# Patient Record
Sex: Male | Born: 1980 | Race: White | Hispanic: No | Marital: Married | Smoking: Never smoker
Health system: Southern US, Community
[De-identification: ages and names within clinical notes are randomized; demographics above are authoritative.]

## PROBLEM LIST (undated history)

## (undated) DIAGNOSIS — K219 Gastro-esophageal reflux disease without esophagitis: Secondary | ICD-10-CM

## (undated) DIAGNOSIS — J309 Allergic rhinitis, unspecified: Secondary | ICD-10-CM

## (undated) DIAGNOSIS — T7840XA Allergy, unspecified, initial encounter: Secondary | ICD-10-CM

## (undated) HISTORY — DX: Allergy, unspecified, initial encounter: T78.40XA

## (undated) HISTORY — PX: ORTHOPEDIC SURGERY: SHX850

## (undated) HISTORY — DX: Allergic rhinitis, unspecified: J30.9

## (undated) HISTORY — PX: COLONOSCOPY: SHX5424

---

## 2012-04-21 ENCOUNTER — Encounter: Payer: Self-pay | Admitting: Family Medicine

## 2012-04-21 ENCOUNTER — Ambulatory Visit (INDEPENDENT_AMBULATORY_CARE_PROVIDER_SITE_OTHER): Payer: BC Managed Care – PPO | Admitting: Family Medicine

## 2012-04-21 VITALS — BP 130/90 | HR 78 | Temp 98.2°F | Ht >= 80 in | Wt 232.0 lb

## 2012-04-21 DIAGNOSIS — J019 Acute sinusitis, unspecified: Secondary | ICD-10-CM

## 2012-04-21 DIAGNOSIS — J309 Allergic rhinitis, unspecified: Secondary | ICD-10-CM | POA: Insufficient documentation

## 2012-04-21 MED ORDER — AMOXICILLIN 875 MG PO TABS: 875.0000 mg | ORAL_TABLET | Freq: Two times a day (BID) | ORAL | Status: AC

## 2012-04-21 NOTE — Progress Notes (Signed)
  Subjective:    Patient ID: George Jenkins, male    DOB: Nov 15, 1980, 31 y.o.   MRN: [id number]  HPI He has a three-day history of difficulty with sore throat, PND, postnasal drainage and now notes some redness in his eyes. He's had no earache, nasal congestion, fever, chills or coughing. He does have an underlying history of allergies and has had previous desensitization which he said did help. He is also had previous testing for EIB which was negative. He does not smoke. He is getting ready to go on a trip to [place].  Review of Systems     Objective:   Physical Exam alert and in no distress. Tympanic membranes and canals are normal. Throat is clear. Tonsils are normal. Neck is supple without adenopathy or thyromegaly. Cardiac exam shows a regular sinus rhythm without murmurs or gallops. Lungs are clear to auscultation. His mucosa is slightly red with questionable tenderness over maxillary sinuses.        Assessment & Plan:   1. Sinusitis acute  amoxicillin (AMOXIL) 875 MG tablet  2. Allergic rhinitis, mild

## 2012-04-21 NOTE — Patient Instructions (Signed)
Use some Afrin nasal spray before he get on the plane just to be safe

## 2012-11-25 ENCOUNTER — Ambulatory Visit (INDEPENDENT_AMBULATORY_CARE_PROVIDER_SITE_OTHER): Payer: BC Managed Care – PPO | Admitting: Medical

## 2012-11-25 ENCOUNTER — Encounter: Payer: Self-pay | Admitting: Medical

## 2012-11-25 VITALS — BP 130/88 | HR 80 | Temp 98.3°F | Resp 16 | Wt 238.0 lb

## 2012-11-25 DIAGNOSIS — J309 Allergic rhinitis, unspecified: Secondary | ICD-10-CM

## 2012-11-25 DIAGNOSIS — J019 Acute sinusitis, unspecified: Secondary | ICD-10-CM

## 2012-11-25 MED ORDER — AMOXICILLIN 875 MG PO TABS: 875.0000 mg | ORAL_TABLET | Freq: Two times a day (BID) | ORAL | Status: DC

## 2012-11-25 NOTE — Progress Notes (Signed)
Subjective:  George Jenkins is a 32 y.o. male who presents for ongoing URI.  He notes about a month of ongoing head congestion, some sinus pressure.  Having yellow nasal discharge, irritated throat.  Some chest congestion.   Showers and hot tea seems to help.  He has longstanding allergies, uses allegra, but this seems to be an infection, worse than his typically sneezing, runny nose, itchy eyes.  Past history is significant for allergies, sinusitis. Patient is a non-smoker.  Using no other OTC decongestants or other for symptoms.  Denies sick contacts.  No other aggravating or relieving factors.  No other c/o.  History reviewed. No pertinent past medical history.  Objective: Filed Vitals:   11/25/12 1347  BP: 130/88  Pulse: 80  Temp: 98.3 F (36.8 C)  Resp: 16    General appearance: Alert, WD/WN, no distress                             Skin: warm, no rash                           Head: +mild maxillary sinus tenderness                            Eyes: conjunctiva normal, corneas clear, PERRLA                            Ears: pearly TMs, external ear canals normal                          Nose: septum midline, turbinates swollen, with erythema              Mouth/throat: MMM, tongue normal, mild pharyngeal erythema                           Neck: supple, no adenopathy, no thyromegaly, nontender                          Heart: RRR, normal S1, S2, no murmurs                         Lungs: CTA bilaterally, no wheezes, rales, or rhonchi      Assessment and Plan:   Encounter Diagnoses  Name Primary?  . Sinusitis, acute Yes  . Allergic rhinitis     Prescription given for Amoxicillin.  Discussed symptomatic relief, and call or return if worse or not improving in 2-3 days.  In general c/o allegra daily, add on nasal saline flush regularly, and can use Afrin prior to flights.

## 2013-12-15 ENCOUNTER — Ambulatory Visit (INDEPENDENT_AMBULATORY_CARE_PROVIDER_SITE_OTHER): Payer: BC Managed Care – PPO | Admitting: Family Medicine

## 2013-12-15 ENCOUNTER — Encounter: Payer: Self-pay | Admitting: Family Medicine

## 2013-12-15 VITALS — BP 114/74 | HR 80 | Wt 244.0 lb

## 2013-12-15 DIAGNOSIS — Z800 Family history of malignant neoplasm of digestive organs: Secondary | ICD-10-CM | POA: Insufficient documentation

## 2013-12-15 DIAGNOSIS — H109 Unspecified conjunctivitis: Secondary | ICD-10-CM

## 2013-12-15 NOTE — Progress Notes (Signed)
   Subjective:    Patient ID: George Jenkins, male    DOB: November 16, 1980, 33 y.o.   MRN: [id number]  HPI He is here for evaluation of a several day history of difficulty with slight redness in the right eye. He was seen recently in an urgent care Center and her on azithromycin. The drainage is clear from the right eye. He is scheduled for a complete exam. He stated he is hard he had a colonoscopy for screening for colon cancer. Apparently his father had colon cancer in his early 4230s. He is approximately 5 years since his last colonoscopy.  Review of Systems     Objective:   Physical Exam Alert and in no distress. TMs normal. No preauricular nodes noted. Throat is clear. Neck is supple without adenopathy. Right conjunctiva is slightly injected.       Assessment & Plan:  Conjunctivitis  Family history of colon cancer  he is to check on when his last colonoscopy was in we will order it on the 5 year schedule. Recommend conservative care for the conjunctivitis.

## 2013-12-23 ENCOUNTER — Encounter: Payer: Self-pay | Admitting: Family Medicine

## 2013-12-28 ENCOUNTER — Ambulatory Visit (INDEPENDENT_AMBULATORY_CARE_PROVIDER_SITE_OTHER): Payer: BC Managed Care – PPO | Admitting: Family Medicine

## 2013-12-28 ENCOUNTER — Telehealth: Payer: Self-pay | Admitting: Internal Medicine

## 2013-12-28 ENCOUNTER — Encounter: Payer: Self-pay | Admitting: Family Medicine

## 2013-12-28 VITALS — BP 120/90 | HR 80 | Ht >= 80 in | Wt 236.0 lb

## 2013-12-28 DIAGNOSIS — K219 Gastro-esophageal reflux disease without esophagitis: Secondary | ICD-10-CM

## 2013-12-28 DIAGNOSIS — Z9289 Personal history of other medical treatment: Secondary | ICD-10-CM

## 2013-12-28 DIAGNOSIS — K602 Anal fissure, unspecified: Secondary | ICD-10-CM

## 2013-12-28 DIAGNOSIS — R7611 Nonspecific reaction to tuberculin skin test without active tuberculosis: Secondary | ICD-10-CM

## 2013-12-28 DIAGNOSIS — Z0000 Encounter for general adult medical examination without abnormal findings: Secondary | ICD-10-CM

## 2013-12-28 DIAGNOSIS — Z800 Family history of malignant neoplasm of digestive organs: Secondary | ICD-10-CM

## 2013-12-28 DIAGNOSIS — J309 Allergic rhinitis, unspecified: Secondary | ICD-10-CM

## 2013-12-28 LAB — CBC WITH DIFFERENTIAL/PLATELET
BASOS ABS: 0.1 10*3/uL (ref 0.0–0.1)
BASOS PCT: 1 % (ref 0–1)
EOS ABS: 0.5 10*3/uL (ref 0.0–0.7)
EOS PCT: 8 % — AB (ref 0–5)
HEMATOCRIT: 45.4 % (ref 39.0–52.0)
Hemoglobin: 15.8 g/dL (ref 13.0–17.0)
LYMPHS PCT: 21 % (ref 12–46)
Lymphs Abs: 1.2 10*3/uL (ref 0.7–4.0)
MCH: 29.5 pg (ref 26.0–34.0)
MCHC: 34.8 g/dL (ref 30.0–36.0)
MCV: 84.7 fL (ref 78.0–100.0)
MONO ABS: 0.5 10*3/uL (ref 0.1–1.0)
Monocytes Relative: 9 % (ref 3–12)
Neutro Abs: 3.5 10*3/uL (ref 1.7–7.7)
Neutrophils Relative %: 61 % (ref 43–77)
Platelets: 301 10*3/uL (ref 150–400)
RBC: 5.36 MIL/uL (ref 4.22–5.81)
RDW: 14.1 % (ref 11.5–15.5)
WBC: 5.8 10*3/uL (ref 4.0–10.5)

## 2013-12-28 LAB — COMPREHENSIVE METABOLIC PANEL
ALT: 27 U/L (ref 0–53)
AST: 23 U/L (ref 0–37)
Albumin: 4.6 g/dL (ref 3.5–5.2)
Alkaline Phosphatase: 55 U/L (ref 39–117)
BUN: 11 mg/dL (ref 6–23)
CALCIUM: 9.6 mg/dL (ref 8.4–10.5)
CHLORIDE: 103 meq/L (ref 96–112)
CO2: 25 mEq/L (ref 19–32)
CREATININE: 1.06 mg/dL (ref 0.50–1.35)
Glucose, Bld: 104 mg/dL — ABNORMAL HIGH (ref 70–99)
Potassium: 4.3 mEq/L (ref 3.5–5.3)
Sodium: 140 mEq/L (ref 135–145)
Total Bilirubin: 0.6 mg/dL (ref 0.2–1.2)
Total Protein: 7.3 g/dL (ref 6.0–8.3)

## 2013-12-28 LAB — LIPID PANEL
CHOL/HDL RATIO: 3.7 ratio
Cholesterol: 165 mg/dL (ref 0–200)
HDL: 45 mg/dL (ref >39)
LDL Cholesterol: 91 mg/dL (ref 0–99)
Triglycerides: 146 mg/dL (ref <150)
VLDL: 29 mg/dL (ref 0–40)

## 2013-12-28 MED ORDER — DILTIAZEM GEL 2 %: 1.0000 "application " | Freq: Two times a day (BID) | CUTANEOUS | Status: DC

## 2013-12-28 NOTE — Progress Notes (Signed)
Subjective:    Patient ID: George ScarletMichael Jenkins, male    DOB: 09-01-1981, 33 y.o.   MRN: [id number]  HPI He is here for a complete examination. He does have a previous history of positive PPD with treatment with INH for 6 months. This was in the early 1970s. He also has a history of reflux disease and did take a PPI but stopped after he did some reading concerning this. He still is having some reflux symptoms and has made some dietary changes. He is a positive family history for colon cancer and had a colonoscopy approximately 5 years ago. The records are being requested. He does have seasonal allergies and responds well to Allegra. He has had difficulty with rectal bleeding for several months and states that he has had difficulty with hemorrhoids. He states he does feel a lump down there when he has the bleeding. He does not smoke and drinks socially. His marriage is going reasonably well. He does state that he and his wife have been involved in counseling. Surgical history significant for right orchiopexy for undescended testes. The rest of his family and social history was reviewed.   Review of Systems  All other systems reviewed and are negative.      Objective:   Physical Exam BP 120/90  Pulse 80  Ht 6\' 9"  (2.057 m)  Wt 236 lb (107.049 kg)  BMI 25.30 kg/m2  General Appearance:    Alert, cooperative, no distress, appears stated age  Head:    Normocephalic, without obvious abnormality, atraumatic  Eyes:    PERRL, conjunctiva/corneas clear, EOM's intact, fundi    benign  Ears:    Normal TM's and external ear canals  Nose:   Nares normal, mucosa normal, no drainage or sinus   tenderness  Throat:   Lips, mucosa, and tongue normal; teeth and gums normal  Neck:   Supple, no lymphadenopathy;  thyroid:  no   enlargement/tenderness/nodules; no carotid   bruit or JVD  Back:    Spine nontender, no curvature, ROM normal, no CVA     tenderness  Lungs:     Clear to auscultation bilaterally without  wheezes, rales or     ronchi; respirations unlabored  Chest Wall:    No tenderness or deformity   Heart:    Regular rate and rhythm, S1 and S2 normal, no murmur, rub   or gallop  Breast Exam:    No chest wall tenderness, masses or gynecomastia  Abdomen:     Soft, non-tender, nondistended, normoactive bowel sounds,    no masses, no hepatosplenomegaly  Genitalia:    Normal male external genitalia without lesions.  Testicles without masses.  No inguinal hernias.  Rectal:    hemorrhoidal tissue is noted. A fissure is noted at the 6:00 position.   Extremities:   No clubbing, cyanosis or edema  Pulses:   2+ and symmetric all extremities  Skin:   Skin color, texture, turgor normal, no rashes or lesions  Lymph nodes:   Cervical, supraclavicular, and axillary nodes normal  Neurologic:   CNII-XII intact, normal strength, sensation and gait; reflexes 2+ and symmetric throughout          Psych:   Normal mood, affect, hygiene and grooming.          Assessment & Plan:  Routine general medical examination at a health care facility - Plan: CBC with Differential, Comprehensive metabolic panel, Lipid panel  History of positive PPD - Plan: DG Chest 2 View  GERD (  gastroesophageal reflux disease)  Allergic rhinitis  Family history of colon cancer  Anal fissure  I will get information concerning colonoscopy and refer to GI for colonoscopy and also to further discuss his reflux symptoms. His allergies are under good control. I will give him diltiazem and see if this will help with his fissure. Explained that it might possibly need surgical intervention.

## 2013-12-28 NOTE — Telephone Encounter (Signed)
Faxed over release form to Dr. Altamese Dillinghristopher Robben @ [phone]

## 2014-01-04 ENCOUNTER — Telehealth: Payer: Self-pay

## 2014-01-04 NOTE — Telephone Encounter (Signed)
I called and left a message with Maralyn SagoSarah at [facility] regarding pt previous colonoscopy

## 2014-01-04 NOTE — Telephone Encounter (Signed)
George Jenkins states that pt did not have colonoscopy at their office and has no documentation of where he could have been referred to to have a colonoscopy done

## 2014-01-18 ENCOUNTER — Encounter: Payer: Self-pay | Admitting: Family Medicine

## 2014-01-19 ENCOUNTER — Encounter: Payer: Self-pay | Admitting: Internal Medicine

## 2014-01-25 ENCOUNTER — Telehealth: Payer: Self-pay

## 2014-01-25 NOTE — Telephone Encounter (Signed)
Pt.notified

## 2014-01-25 NOTE — Telephone Encounter (Signed)
The chest x-ray should be coated has followup on positive PPD. Tell him he would need to check with GI concerning screening but should most likely screening colonoscopy with family history of colon cancer

## 2014-01-25 NOTE — Telephone Encounter (Signed)
Called pt to inform him of OV with Dr. Bosie ClosSchooler at [facility] and pt states that he is needing to know how the info was coded in order for him to have a colonoscopy as well as the chest x-ray that you are wanting him to have. He does not want to proceed with either until he knows how you have coded the chest x ray and for him to have a repeat colonoscopy for insurance purposes. Please advise

## 2014-06-30 ENCOUNTER — Ambulatory Visit (INDEPENDENT_AMBULATORY_CARE_PROVIDER_SITE_OTHER): Payer: BC Managed Care – PPO | Admitting: Family Medicine

## 2014-06-30 ENCOUNTER — Encounter: Payer: Self-pay | Admitting: Family Medicine

## 2014-06-30 VITALS — BP 130/90 | HR 80 | Temp 97.9°F | Ht >= 80 in | Wt 243.0 lb

## 2014-06-30 DIAGNOSIS — J209 Acute bronchitis, unspecified: Secondary | ICD-10-CM

## 2014-06-30 MED ORDER — AMOXICILLIN 875 MG PO TABS: 875.0000 mg | ORAL_TABLET | Freq: Two times a day (BID) | ORAL | Status: DC

## 2014-06-30 NOTE — Progress Notes (Signed)
   Subjective:    Patient ID: George Jenkins, male    DOB: 04-23-1981, 33 y.o.   MRN: [id number]  HPI He has a five-day history that started with a slight sore throat followed less than 24 hours by productive cough, nasal congestion but no fever, chills, earache. He has a previous history of difficulty with this. He did use NyQuil for this. He does not smoke.   Review of Systems     Objective:   Physical Exam alert and in no distress. Tympanic membranes and canals are normal. Throat is clear. Tonsils are normal. Neck is supple without adenopathy or thyromegaly. Cardiac exam shows a regular sinus rhythm without murmurs or gallops. Lungs are clear to auscultation.        Assessment & Plan:  Acute bronchitis, unspecified organism - Plan: amoxicillin (AMOXIL) 875 MG tablet  recommend using Afrin nasal spray when he flies to equalize sinus pressure.

## 2014-07-26 ENCOUNTER — Telehealth: Payer: Self-pay | Admitting: Internal Medicine

## 2014-07-26 MED ORDER — AZITHROMYCIN 500 MG PO TABS: 500.0000 mg | ORAL_TABLET | Freq: Every day | ORAL | Status: DC

## 2014-07-26 NOTE — Telephone Encounter (Signed)
Pt states that he has an URI- coughing up yellow mucous, no sinus pain or headache. Also has pink eye in right eye that he woke up with. He said he talked to you this morning about it.  Send something to [employer] [place]

## 2014-07-26 NOTE — Telephone Encounter (Signed)
He has had continued difficulty with productive cough and now pink eye. I will switch him to azithromycin

## 2014-10-29 ENCOUNTER — Ambulatory Visit (INDEPENDENT_AMBULATORY_CARE_PROVIDER_SITE_OTHER): Payer: BC Managed Care – PPO | Admitting: Medical

## 2014-10-29 ENCOUNTER — Encounter: Payer: Self-pay | Admitting: Medical

## 2014-10-29 VITALS — BP 120/70 | HR 84 | Temp 98.2°F

## 2014-10-29 DIAGNOSIS — J40 Bronchitis, not specified as acute or chronic: Secondary | ICD-10-CM

## 2014-10-29 DIAGNOSIS — J329 Chronic sinusitis, unspecified: Secondary | ICD-10-CM

## 2014-10-29 DIAGNOSIS — J3089 Other allergic rhinitis: Secondary | ICD-10-CM

## 2014-10-29 DIAGNOSIS — R059 Cough, unspecified: Secondary | ICD-10-CM

## 2014-10-29 DIAGNOSIS — R05 Cough: Secondary | ICD-10-CM

## 2014-10-29 MED ORDER — AMOXICILLIN 500 MG PO TABS: ORAL_TABLET | ORAL | Status: DC

## 2014-10-29 NOTE — Progress Notes (Signed)
Subjective:  George Jenkins is a 34 y.o. male who presents for possible respiratory infection.  Symptoms include head and chest congestion,yellow phlegm, productive cough.  Denies fever, NV, ear pain,sore throat, SOB, wheezing.  Past history is significant for allergies, and gets sick on the road travelling.  Patient is a non-smoker.  Using hot tea for symptoms.  Denies sick contacts.  College coach, on the road all the time, bus, plane, in hotels.  No other aggravating or relieving factors.  No other c/o.  ROS as in subjective   Objective: Filed Vitals:   10/29/14 1337  BP: 120/70  Pulse: 84  Temp: 98.2 F (36.8 C)    General appearance: Alert, WD/WN, no distress                             Skin: warm, no rash                           Head: +frontal sinus tenderness,                            Eyes: conjunctiva normal, corneas clear, PERRLA                            Ears: pearly TMs, external ear canals normal                          Nose: septum midline, turbinates swollen, with erythema and mucoid discharge             Mouth/throat: MMM, tongue normal, mild pharyngeal erythema                           Neck: supple, no adenopathy, no thyromegaly, nontender                          Heart: RRR, normal S1, S2, no murmurs                         Lungs: mild bronchial breath sounds, no wheezes, rales, or rhonchi      Assessment and Plan:  Encounter Diagnoses  Name Primary?  . Sinobronchitis Yes  . Cough   . Other allergic rhinitis    Prescription given for Amoxicillin.  Can use OTC Mucinex for congestion.  Tylenol or Ibuprofen OTC for fever and malaise.  Discussed symptomatic relief, nasal saline flush, and call or return if worse or not improving in 2-3 days.  Consider daily antihistamine.  C/t neti pot daily.  Good hydration.

## 2016-11-05 ENCOUNTER — Telehealth: Payer: Self-pay | Admitting: Family Medicine

## 2016-12-03 NOTE — Telephone Encounter (Signed)
dt ?

## 2017-01-18 ENCOUNTER — Ambulatory Visit (INDEPENDENT_AMBULATORY_CARE_PROVIDER_SITE_OTHER): Payer: BC Managed Care – PPO | Admitting: Family Medicine

## 2017-01-18 ENCOUNTER — Encounter: Payer: Self-pay | Admitting: Family Medicine

## 2017-01-18 VITALS — BP 120/78 | HR 109 | Temp 98.1°F | Wt 258.0 lb

## 2017-01-18 DIAGNOSIS — J209 Acute bronchitis, unspecified: Secondary | ICD-10-CM | POA: Diagnosis not present

## 2017-01-18 DIAGNOSIS — L729 Follicular cyst of the skin and subcutaneous tissue, unspecified: Secondary | ICD-10-CM | POA: Diagnosis not present

## 2017-01-18 DIAGNOSIS — Q828 Other specified congenital malformations of skin: Secondary | ICD-10-CM | POA: Diagnosis not present

## 2017-01-18 DIAGNOSIS — J309 Allergic rhinitis, unspecified: Secondary | ICD-10-CM | POA: Diagnosis not present

## 2017-01-18 DIAGNOSIS — R7611 Nonspecific reaction to tuberculin skin test without active tuberculosis: Secondary | ICD-10-CM

## 2017-01-18 MED ORDER — AZITHROMYCIN 500 MG PO TABS: 500.0000 mg | ORAL_TABLET | Freq: Every day | ORAL | 0 refills | Status: DC

## 2017-01-18 NOTE — Progress Notes (Signed)
   Subjective:    Patient ID: George ScarletMichael Jenkins, male    DOB: 15-May-1981, 36 y.o.   MRN: [id number]  HPI He is here for multiple complaints. He notes difficulty with a three-week history of chest and nasal congestion, slight sore throat. The rhinorrhea has been purulent in nature. No fever, chills, earache, shortness of breath. He also complains of noting a lesion in the right lower neck area. He does have underlying allergies with sneezing, itchy watery eyes and they seem to be mainly seasonal. He is using Zyrtec for this. He does not smoke. He also has some lesions on his neck other than the lump that he would like evaluated. Also of note is that he states he was treated for positive PPD when he was a child with 6 months of INH.  Review of Systems     Objective:   Physical Exam Alert and in no distress. Tympanic membranes and canals are normal. Pharyngeal area is normal. Neck is supple without adenopathy or thyromegaly. Cardiac exam shows a regular sinus rhythm without murmurs or gallops. Lungs are clear to auscultation. Exam of the right lower neck area does show round smooth movable 1 cm lesion. He also has skin tags in that area as well.        Assessment & Plan:  Acute bronchitis, unspecified organism - Plan: azithromycin (ZITHROMAX) 500 MG tablet  Allergic rhinitis, mild  Positive PPD, treated  Accessory skin tags  Benign skin cyst He is to call me in 7-10 days if he is not over the cough and congestion. Recommend he continue on Zyrtec for his underlying allergies. I explained that the cyst was nothing to worry about and would leave alone less becomes hot red and tender. Also explained that he should not have any more TB skin tests as this could be a severe local reaction from that.  I discussed the treatment of the skin tags. He will probably trying get rid of them on his own.

## 2017-03-04 ENCOUNTER — Telehealth: Payer: Self-pay | Admitting: Family Medicine

## 2017-03-04 DIAGNOSIS — J209 Acute bronchitis, unspecified: Secondary | ICD-10-CM

## 2017-03-04 MED ORDER — AZITHROMYCIN 500 MG PO TABS: 500.0000 mg | ORAL_TABLET | Freq: Every day | ORAL | 0 refills | Status: DC

## 2017-03-04 NOTE — Telephone Encounter (Signed)
He started having difficulty 7-10 days ago with nasal congestion, malaise and fatigue. 2 days ago he had the start of the productive cough as well as postnasal drainage. I will give him azithromycin which has worked for him in the past.

## 2017-03-04 NOTE — Telephone Encounter (Signed)
Patient called said he was told to call you with his symptons for URI He has 3 day history of yellow mucous and cough [facility]

## 2017-04-10 ENCOUNTER — Other Ambulatory Visit: Payer: Self-pay

## 2017-04-10 ENCOUNTER — Telehealth: Payer: Self-pay | Admitting: Family Medicine

## 2017-04-10 DIAGNOSIS — J209 Acute bronchitis, unspecified: Secondary | ICD-10-CM

## 2017-04-10 MED ORDER — AZITHROMYCIN 500 MG PO TABS: 500.0000 mg | ORAL_TABLET | Freq: Every day | ORAL | 0 refills | Status: DC

## 2017-04-10 NOTE — Telephone Encounter (Signed)
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 Pt has been fighting this for about 5 days when the mucus first started but when he woke up this morning his mucus is thicker and yellow. He will not be in town to come in for an appointment for several day. Pt requesting to speal to Dr Susann GivensLalonde and if meds can be sent in for him, [facility] [address], [facility] [place]

## 2017-04-10 NOTE — Telephone Encounter (Signed)
Call in azithromycin 500 mg 1 pill daily,#3

## 2017-04-10 NOTE — Telephone Encounter (Signed)
Sent med in 

## 2017-09-15 ENCOUNTER — Emergency Department (HOSPITAL_COMMUNITY): Payer: BC Managed Care – PPO

## 2017-09-15 ENCOUNTER — Encounter (HOSPITAL_COMMUNITY): Payer: Self-pay | Admitting: Emergency Medicine

## 2017-09-15 ENCOUNTER — Emergency Department (HOSPITAL_COMMUNITY)
Admission: EM | Admit: 2017-09-15 | Discharge: 2017-09-15 | Disposition: A | Discharge status: Home / Self Care | Payer: BC Managed Care – PPO | Attending: Emergency Medicine | Admitting: Emergency Medicine

## 2017-09-15 ENCOUNTER — Other Ambulatory Visit: Payer: Self-pay

## 2017-09-15 DIAGNOSIS — Y9389 Activity, other specified: Secondary | ICD-10-CM | POA: Diagnosis not present

## 2017-09-15 DIAGNOSIS — R1110 Vomiting, unspecified: Secondary | ICD-10-CM | POA: Diagnosis present

## 2017-09-15 DIAGNOSIS — Y929 Unspecified place or not applicable: Secondary | ICD-10-CM | POA: Insufficient documentation

## 2017-09-15 DIAGNOSIS — Y999 Unspecified external cause status: Secondary | ICD-10-CM | POA: Diagnosis not present

## 2017-09-15 DIAGNOSIS — T18108A Unspecified foreign body in esophagus causing other injury, initial encounter: Secondary | ICD-10-CM | POA: Insufficient documentation

## 2017-09-15 DIAGNOSIS — Z79899 Other long term (current) drug therapy: Secondary | ICD-10-CM | POA: Diagnosis not present

## 2017-09-15 DIAGNOSIS — X58XXXA Exposure to other specified factors, initial encounter: Secondary | ICD-10-CM | POA: Diagnosis not present

## 2017-09-15 HISTORY — DX: Gastro-esophageal reflux disease without esophagitis: K21.9

## 2017-09-15 LAB — CBC
HCT: 46.2 % (ref 39.0–52.0)
Hemoglobin: 15.4 g/dL (ref 13.0–17.0)
MCH: 29.6 pg (ref 26.0–34.0)
MCHC: 33.3 g/dL (ref 30.0–36.0)
MCV: 88.7 fL (ref 78.0–100.0)
PLATELETS: 306 10*3/uL (ref 150–400)
RBC: 5.21 MIL/uL (ref 4.22–5.81)
RDW: 12.9 % (ref 11.5–15.5)
WBC: 16.2 10*3/uL — ABNORMAL HIGH (ref 4.0–10.5)

## 2017-09-15 LAB — URINALYSIS, ROUTINE W REFLEX MICROSCOPIC
BILIRUBIN URINE: NEGATIVE
GLUCOSE, UA: NEGATIVE mg/dL
Hgb urine dipstick: NEGATIVE
KETONES UR: 20 mg/dL — AB
Leukocytes, UA: NEGATIVE
Nitrite: NEGATIVE
PROTEIN: NEGATIVE mg/dL
Specific Gravity, Urine: 1.027 (ref 1.005–1.030)
pH: 6 (ref 5.0–8.0)

## 2017-09-15 LAB — COMPREHENSIVE METABOLIC PANEL
ALK PHOS: 50 U/L (ref 38–126)
ALT: 32 U/L (ref 17–63)
AST: 25 U/L (ref 15–41)
Albumin: 4.3 g/dL (ref 3.5–5.0)
Anion gap: 12 (ref 5–15)
BUN: 12 mg/dL (ref 6–20)
CALCIUM: 9.3 mg/dL (ref 8.9–10.3)
CHLORIDE: 102 mmol/L (ref 101–111)
CO2: 25 mmol/L (ref 22–32)
CREATININE: 1.1 mg/dL (ref 0.61–1.24)
GFR calc non Af Amer: >60 mL/min (ref >60)
GLUCOSE: 103 mg/dL — AB (ref 65–99)
Potassium: 3.9 mmol/L (ref 3.5–5.1)
SODIUM: 139 mmol/L (ref 135–145)
Total Bilirubin: 1.5 mg/dL — ABNORMAL HIGH (ref 0.3–1.2)
Total Protein: 8 g/dL (ref 6.5–8.1)

## 2017-09-15 LAB — LIPASE, BLOOD: Lipase: 31 U/L (ref 11–51)

## 2017-09-15 MED ORDER — FAMOTIDINE 20 MG PO TABS: 20.0000 mg | ORAL_TABLET | Freq: Once | ORAL | Status: DC

## 2017-09-15 NOTE — Discharge Instructions (Signed)
Your testing today has been unremarkable and thankfully very normal.  Your white blood cell count was elevated but I suspect this was related to the inflammation that was caused by the food that was stuck in your esophagus.  Now that that has gone down your blood counts should return to normal.  Please take Pepcid twice daily for 2 weeks then once a day from then on.  You will need to follow-up with gastroenterology, I have given you the phone number for the group that is on call today.  Return to the emergency department for severe or worsening symptoms but until you follow-up with the GI specialist that you should be eating soft foods, chew it very well and take small bites, lots of liquids to wash it down.

## 2017-09-15 NOTE — ED Notes (Signed)
Patient transported to X-ray 

## 2017-09-15 NOTE — ED Triage Notes (Signed)
Pt states last night @ 1930 after eating chicken sandwich, pt states he is unable to swallow his own saliva or water x 24 hours. Pt states he has had this in the past and can usually resolve it with vomiting.

## 2017-09-15 NOTE — ED Notes (Signed)
Pt attempted to drink water, pt immediately vomited majority of water into emesis bag. Pt states he believes a very small amount of water may have passed but that was the first time in 24 hours.

## 2017-09-15 NOTE — ED Notes (Signed)
ED Provider at bedside. 

## 2017-09-15 NOTE — ED Provider Notes (Signed)
[facility] EMERGENCY [facility] Provider Note   CSN: [id number] Arrival date & time: 09/15/17  1825     History   Chief Complaint Chief Complaint  Patient presents with  . Emesis    HPI George Jenkins is a 36 y.o. male.  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  He also notes that he has been drinking lots of alcohol at that time.  Since then he has cut back significantly and is done very well but reports historically several episodes a year of having difficulty swallowing when he chews solid foods incompletely.  Last night he took a chicken sandwich, he felt as though it got lodged in the middle of his chest and he was unable to swallow anything after that.  Nothing vomited up however he has not been able to tolerate any food fluids or even his saliva for the last 24 hours.  He reports that the discomfort in the middle of his chest is now gone away and while he was in the waiting room within the last 30 minutes he has finally been able to drink liquids and feels like they are going down normally.  There is no chest pain or shortness of breath at this time, he denies any other symptoms, he is not currently taking anything for acid reflux though he has been on Prilosec in the past.  He has not had an endoscopy in the past.  Past Medical History:  Diagnosis Date  . Allergic rhinitis   . Allergy   . GERD (gastroesophageal reflux disease)     Patient Active Problem List   Diagnosis Date Noted  . Family history of colon cancer 12/15/2013  . Allergic rhinitis, mild 04/21/2012    History reviewed. No pertinent surgical history.     Home Medications    Prior to Admission medications   Medication Sig Start Date End Date Taking? Authorizing Provider  azithromycin  (ZITHROMAX) 500 MG tablet Take 1 tablet (500 mg total) by mouth daily. 04/10/17   Ronnald NianLalonde, John C, MD  cetirizine (ZYRTEC) 10 MG tablet Take 10 mg by mouth daily.    [provider]    Family History Family History  Problem Relation Age of Onset  . Arthritis Mother     Social History Social History   Tobacco Use  . Smoking status: Never Smoker  . Smokeless tobacco: Never Used  Substance Use Topics  . Alcohol use: Yes    Alcohol/week: 2.4 oz    Types: 4 Glasses of wine per week  . Drug use: No     Allergies   Iodine   Review of Systems Review of Systems  All other systems reviewed and are negative.  Physical Exam Updated Vital Signs BP (!) 152/95   Pulse 94   Temp 98.4 F (36.9 C) (Oral)   Resp 16   Ht 6\' 9"  (2.057 m)   Wt 113.4 kg (250 lb)   SpO2 98%   BMI 26.79 kg/m   Physical Exam  Constitutional: He appears well-developed and well-nourished. No distress.  HENT:  Head: Normocephalic and atraumatic.  Mouth/Throat: Oropharynx is clear and moist. No oropharyngeal exudate.  Eyes: Conjunctivae and EOM are normal. Pupils are equal, round, and reactive to light. Right eye exhibits no discharge. Left eye exhibits no discharge. No scleral  icterus.  Neck: Normal range of motion. Neck supple. No JVD present. No thyromegaly present.  Cardiovascular: Normal rate, regular rhythm, normal heart sounds and intact distal pulses. Exam reveals no gallop and no friction rub.  No murmur heard. Pulmonary/Chest: Effort normal and breath sounds normal. No respiratory distress. He has no wheezes. He has no rales.  Abdominal: Soft. Bowel sounds are normal. He exhibits no distension and no mass. There is no tenderness.  Musculoskeletal: Normal range of motion. He exhibits no edema or tenderness.  Lymphadenopathy:    He has no cervical adenopathy.  Neurological: He is alert. Coordination normal.  Skin: Skin is warm and dry. No rash noted. No erythema.  Psychiatric: He has a  normal mood and affect. His behavior is normal.  Nursing note and vitals reviewed.   ED Treatments / Results  Labs (all labs ordered are listed, but only abnormal results are displayed) Labs Reviewed  COMPREHENSIVE METABOLIC PANEL - Abnormal; Notable for the following components:      Result Value   Glucose, Bld 103 (*)    Total Bilirubin 1.5 (*)    All other components within normal limits  CBC - Abnormal; Notable for the following components:   WBC 16.2 (*)    All other components within normal limits  URINALYSIS, ROUTINE W REFLEX MICROSCOPIC - Abnormal; Notable for the following components:   Ketones, ur 20 (*)    All other components within normal limits  LIPASE, BLOOD     Radiology Dg Chest 2 View  Result Date: 09/15/2017 CLINICAL DATA:  Esophageal foreign body EXAM: CHEST  2 VIEW COMPARISON:  None. FINDINGS: The heart size and mediastinal contours are within normal limits. Both lungs are clear. The visualized skeletal structures are unremarkable. IMPRESSION: No active cardiopulmonary disease. Electronically Signed   By: Marlan Palau M.D.   On: 09/15/2017 21:02    Procedures Procedures (including critical care time)  Medications Ordered in ED Medications - No data to display   Initial Impression / Assessment and Plan / ED Course  I have reviewed the triage vital signs and the nursing notes.  Pertinent labs & imaging results that were available during my care of the patient were reviewed by me and considered in my medical decision making (see chart for details).     The patient's exam is totally unremarkable, his physical exam is reassuring without any signs of abdominal tenderness difficulty breathing or arrhythmia.  His history is very consistent with having an esophageal stricture or a retained foreign body however at this time he is now drinking liquids without difficulty.  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  The patient will be started on antihistamine such as Pepcid or Zantac.  He does not need any urgent endoscopy or emergent procedures at this time.  The patient expresses understanding to the plan of care.  Chest x-ray pending to rule out any injury to the esophagus given the patient's well appearance despite the slight leukocytosis I suspect the x-ray will be unremarkable  Tolerated p.o. trial with liquids and solids, x-ray negative, GI follow-up outpatient, patient expressed understanding to food and meal planning and precautions and return indications  Final Clinical Impressions(s) / ED Diagnoses   Final diagnoses:  Esophageal foreign body, initial encounter    ED Discharge Orders    None       Eber Hong,  MD 09/15/17 2112

## 2017-09-18 ENCOUNTER — Telehealth: Payer: Self-pay | Admitting: Gastroenterology

## 2017-09-18 ENCOUNTER — Encounter: Payer: Self-pay | Admitting: Physician Assistant

## 2017-09-18 ENCOUNTER — Ambulatory Visit: Payer: BC Managed Care – PPO | Admitting: Physician Assistant

## 2017-09-18 ENCOUNTER — Encounter: Payer: Self-pay | Admitting: Family Medicine

## 2017-09-18 ENCOUNTER — Ambulatory Visit: Payer: BC Managed Care – PPO | Admitting: Family Medicine

## 2017-09-18 VITALS — BP 132/84 | HR 82 | Ht >= 80 in | Wt 253.4 lb

## 2017-09-18 VITALS — BP 142/88 | HR 81 | Ht >= 80 in | Wt 254.0 lb

## 2017-09-18 DIAGNOSIS — Z800 Family history of malignant neoplasm of digestive organs: Secondary | ICD-10-CM | POA: Diagnosis not present

## 2017-09-18 DIAGNOSIS — T18128A Food in esophagus causing other injury, initial encounter: Secondary | ICD-10-CM

## 2017-09-18 DIAGNOSIS — K222 Esophageal obstruction: Secondary | ICD-10-CM

## 2017-09-18 DIAGNOSIS — K219 Gastro-esophageal reflux disease without esophagitis: Secondary | ICD-10-CM | POA: Diagnosis not present

## 2017-09-18 DIAGNOSIS — R1310 Dysphagia, unspecified: Secondary | ICD-10-CM

## 2017-09-18 NOTE — Telephone Encounter (Signed)
Patient with ED visit over the holiday for food impaction.  C/o continued dysphagia .  He will come in this am and see Hyacinth MeekerJennifer  Lemmon, PA at 10:15

## 2017-09-18 NOTE — Progress Notes (Signed)
   Subjective:    Patient ID: George Jenkins, male    DOB: 1981-09-02, 37 y.o.   MRN: [id number]  HPI He is here for evaluation of a recent episode of what he calls reflux.  He has a previous history of difficulty with reflux and had been on Prilosec for an extended period of time but stopped it due to potential questions concerning long-term use of the medication.  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  Last Saturday after eating a chicken sandwich.  He states that he had a episode of reflux however upon further questioning, he describes more of a symptoms of obstruction.  He had difficulty rest of Saturday and all day Sunday.  He tried while in the emergency room liquid Maalox with no success and did go to the emergency room.  Apparently the symptoms subsided and he is was able to swallow.  Since then he has noted that when he eats, he feels as if food could get stuck but it does not.   Review of Systems     Objective:   Physical Exam Alert and in no distress.  Cardiac exam shows regular rhythm without murmurs or gallops.  Lungs are clear to auscultation.  Abdominal exam is benign.       Assessment & Plan:  Esophageal obstruction due to food impaction - Plan: Ambulatory referral to Gastroenterology Symptoms to me sound more consistent with obstruction than true reflux.

## 2017-09-18 NOTE — Progress Notes (Signed)
Chief Complaint: Follow up from ED and food impaction  HPI:  George Jenkins is a 37 year old male with a past medical history as listed below, who was referred to me by Ronnald Nian, MD after being seen in the ER for an acute food impaction.   Per review of chart patient was in the ER 09/15/17 and at that time described that he was a former Division I athlete and was on a large amount of anti-inflammatories at that time but had to stop them secondary to developing fairly severe acid reflux.  He was also drinking a lot of etoh at that time. He has cut back on this as well but reports history of having trouble with swallowing. He reported eating a chickem samdwhich and felt as though it got lodged in his throat.   Today, the patient reports that he has had a history of "regurgitation" which was diagnosed as reflux since back in his 20's.  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  He was maintained on Omeprazole 20mg  qd for some time while on this medication, but read about the side effects and stopped this for the past 6 years.  Most recently, he has been having occasional issues with regurgitation and also feeling as though food is getting caught in his throat. He describes the chicken sandwhich incident above. Since being in the ER he has restarted his Prilosec 20mg  qd and is eating mostly softer foods. Associated symptoms include some epigastric pain after this incident which is better now.   Patient also reports family history of colon cancer in his Maternal Grandfather in his 74's. Patient had a colonoscopy 7-8 years ago which was normal per him.   Patient denies fever, chills, weight loss, anorexia, nausea, vomiting, abdominal pain, change in bowel habits or melena.  Past Medical History:  Diagnosis Date  . Allergic rhinitis   . Allergy   . GERD (gastroesophageal reflux disease)     Past Surgical History:  Procedure Laterality  Date  . COLONOSCOPY     7-9 yrs ago in [place]  . ORTHOPEDIC SURGERY      Current Outpatient Medications  Medication Sig Dispense Refill  . omeprazole (PRILOSEC) 20 MG capsule Take 20 mg by mouth daily.    . cetirizine (ZYRTEC) 10 MG tablet Take 10 mg by mouth daily.     No current facility-administered medications for this visit.     Allergies as of 09/18/2017 - Review Complete 09/18/2017  Allergen Reaction Noted  . Iodine  04/21/2012    Family History  Problem Relation Age of Onset  . Arthritis Mother   . Colon cancer Maternal Grandfather     Social History   Socioeconomic History  . Marital status: Married    Spouse name: Not on file  . Number of children: Not on file  . Years of education: Not on file  . Highest education level: Not on file  Social Needs  . Financial resource strain: Not on file  . Food insecurity - worry: Not on file  . Food insecurity - inability: Not on file  . Transportation needs - medical: Not on file  . Transportation needs - non-medical: Not on file  Occupational History  . Not on file  Tobacco Use  . Smoking status: Never Smoker  . Smokeless tobacco: Never Used  Substance and Sexual Activity  . Alcohol use: Yes    Alcohol/week: 2.4 oz  Types: 4 Glasses of wine per week  . Drug use: No  . Sexual activity: Yes    Partners: Female  Other Topics Concern  . Not on file  Social History Narrative  . Not on file    Review of Systems:    Constitutional: No weight loss, fever, chills, weakness or fatigue Skin: No rash  Cardiovascular: No chest pain Respiratory: No SOB  Gastrointestinal: See HPI and otherwise negative Genitourinary: No dysuria Neurological: No headache, dizziness or syncope Musculoskeletal: No new muscle or joint pain Hematologic: No bleeding  Psychiatric: No history of depression or anxiety   Physical Exam:  Vital signs: BP (!) 142/88   Pulse 81   Ht 6\' 9"  (2.057 m)   Wt 254 lb (115.2 kg)   BMI 27.22 kg/m     Constitutional:   Pleasant Caucasian male appears to be in NAD, Well developed, Well nourished, alert and cooperative Head:  Normocephalic and atraumatic. Eyes:   PEERL, EOMI. No icterus. Conjunctiva pink. Ears:  Normal auditory acuity. Neck:  Supple Throat: Oral cavity and pharynx without inflammation, swelling or lesion.  Respiratory: Respirations even and unlabored. Lungs clear to auscultation bilaterally.   No wheezes, crackles, or rhonchi.  Cardiovascular: Normal S1, S2. No MRG. Regular rate and rhythm. No peripheral edema, cyanosis or pallor.  Gastrointestinal:  Soft, nondistended, mild epigastric ttp. No rebound or guarding. Normal bowel sounds. No appreciable masses or hepatomegaly. Rectal:  Not performed.  Msk:  Symmetrical without gross deformities. Without edema, no deformity or joint abnormality.  Neurologic:  Alert and  oriented x4;  grossly normal neurologically.  Skin:   Dry and intact without significant lesions or rashes. Psychiatric: Demonstrates good judgement and reason without abnormal affect or behaviors.  RELEVANT LABS AND IMAGING: CBC    Component Value Date/Time   WBC 16.2 (H) 09/15/2017 1931   RBC 5.21 09/15/2017 1931   HGB 15.4 09/15/2017 1931   HCT 46.2 09/15/2017 1931   PLT 306 09/15/2017 1931   MCV 88.7 09/15/2017 1931   MCH 29.6 09/15/2017 1931   MCHC 33.3 09/15/2017 1931   RDW 12.9 09/15/2017 1931   LYMPHSABS 1.2 12/28/2013 0854   MONOABS 0.5 12/28/2013 0854   EOSABS 0.5 12/28/2013 0854   BASOSABS 0.1 12/28/2013 0854    CMP     Component Value Date/Time   NA 139 09/15/2017 1931   K 3.9 09/15/2017 1931   CL 102 09/15/2017 1931   CO2 25 09/15/2017 1931   GLUCOSE 103 (H) 09/15/2017 1931   BUN 12 09/15/2017 1931   CREATININE 1.10 09/15/2017 1931   CREATININE 1.06 12/28/2013 0854   CALCIUM 9.3 09/15/2017 1931   PROT 8.0 09/15/2017 1931   ALBUMIN 4.3 09/15/2017 1931   AST 25 09/15/2017 1931   ALT 32 09/15/2017 1931   ALKPHOS 50  09/15/2017 1931   BILITOT 1.5 (H) 09/15/2017 1931   GFRNONAA >60 09/15/2017 1931   GFRAA >60 09/15/2017 1931    Assessment: 1. Dysphagia: s/p ER for food impaction on 09/15/17; consider esophageal stricture vs ring vs web 2. GERD: for the past 15 years or so after heavy anti-inflammatory use and etoh use, continues with regurgitation and now dysphagia as above 3. Family history of colon cancer: maternal grandfather in his 5230's  Plan: 1. Scheduled patient for an EGD in the [facility] with Dr. Marina GoodellPerry at his next available. Reviewed risks, benefits, limitations and alternatives and the patient agrees to proceed. 2. Continue Omeprazole 20mg  qd , 30-60 min before breakfast.  Discussed that the patient may benefit from a higher dose in the future pending EGD results. He is worried regarding side effects and so we did not start him on increased dose today. 3. Reviewed anti-dysphagia and anti-reflux measures. Provided handout 4. Discussed family history of colon cancer. He is not at increased risk. No need for repeat colonoscopy at this time. 5. Patient to follow in clinic per recommendations from Dr. Marina Goodell after time of procedure  Hyacinth Meeker, PA-C  Gastroenterology 09/18/2017, 10:14 AM  Cc: Ronnald Nian, MD

## 2017-09-18 NOTE — Progress Notes (Signed)
Assessment and plans reviewed  

## 2017-09-18 NOTE — Patient Instructions (Signed)
Continue your Omeprazole 20 mg daily 30-60 minutes before breakfast.  We have given you a GERD handout.   You have been scheduled for an endoscopy. Please follow written instructions given to you at your visit today. If you use inhalers (even only as needed), please bring them with you on the day of your procedure. Your physician has requested that you go to www.[email or web address].com and enter the access code given to you at your visit today. This web site gives a general overview about your procedure. However, you should still follow specific instructions given to you by our office regarding your preparation for the procedure.

## 2017-09-27 ENCOUNTER — Encounter: Payer: Self-pay | Admitting: Internal Medicine

## 2017-10-07 ENCOUNTER — Encounter: Payer: BC Managed Care – PPO | Admitting: Internal Medicine

## 2017-11-15 ENCOUNTER — Other Ambulatory Visit: Payer: Self-pay | Admitting: Family Medicine

## 2017-11-15 MED ORDER — AZITHROMYCIN 500 MG PO TABS: 500.0000 mg | ORAL_TABLET | Freq: Every day | ORAL | 0 refills | Status: DC

## 2017-12-02 ENCOUNTER — Telehealth: Payer: Self-pay | Admitting: Internal Medicine

## 2017-12-02 NOTE — Telephone Encounter (Signed)
No charge. 

## 2017-12-03 ENCOUNTER — Encounter: Payer: BC Managed Care – PPO | Admitting: Internal Medicine

## 2017-12-17 ENCOUNTER — Encounter: Payer: Self-pay | Admitting: Family Medicine

## 2017-12-17 ENCOUNTER — Ambulatory Visit: Payer: BC Managed Care – PPO | Admitting: Family Medicine

## 2017-12-17 VITALS — BP 118/80 | HR 83 | Temp 97.8°F | Wt 257.4 lb

## 2017-12-17 DIAGNOSIS — J309 Allergic rhinitis, unspecified: Secondary | ICD-10-CM | POA: Diagnosis not present

## 2017-12-17 DIAGNOSIS — K222 Esophageal obstruction: Secondary | ICD-10-CM

## 2017-12-17 DIAGNOSIS — G479 Sleep disorder, unspecified: Secondary | ICD-10-CM

## 2017-12-17 DIAGNOSIS — J209 Acute bronchitis, unspecified: Secondary | ICD-10-CM

## 2017-12-17 DIAGNOSIS — T18128A Food in esophagus causing other injury, initial encounter: Secondary | ICD-10-CM

## 2017-12-17 MED ORDER — AZITHROMYCIN 500 MG PO TABS: 500.0000 mg | ORAL_TABLET | Freq: Every day | ORAL | 0 refills | Status: AC

## 2017-12-17 MED ORDER — AZITHROMYCIN 500 MG PO TABS: 500.0000 mg | ORAL_TABLET | Freq: Every day | ORAL | 0 refills | Status: DC

## 2017-12-17 NOTE — Progress Notes (Signed)
   Subjective:    Patient ID: George Jenkins, male    DOB: 19-Jun-1981, 37 y.o.   MRN: [id number]  HPI 3 days ago he noted the onset of a productive cough with sinus congestion, fatigue, PND but no fever, chills, sore throat or earache.  He has had difficulty in the past with sinus infections.  He does have a history of seasonal allergies. He did not follow-up with GI referral for an endoscopy.  He does have a previous history of his suggestive of esophageal obstruction but states that he has not had difficulty with that recently. He has been quite fatigued over the last several months due to a very busy basketball season.  He is interested in making some dietary changes to help with weight reduction. Also during the basketball season he does have difficulty with sleep disturbance but is not interested in medication.  He is to call me when he finishes the antibiotic if not totally back to normal after at least a week.  Review of Systems     Objective:   Physical Exam Alert and in no distress.  Nasal mucosa is quite red with and edematous.  Tympanic membranes and canals are normal. Pharyngeal area is normal. Neck is supple without adenopathy or thyromegaly. Cardiac exam shows a regular sinus rhythm without murmurs or gallops. Lungs are clear to auscultation.        Assessment & Plan:  Acute bronchitis, unspecified organism - Plan: azithromycin (ZITHROMAX) 500 MG tablet, DISCONTINUED: azithromycin (ZITHROMAX) 500 MG tablet  Allergic rhinitis, mild  Esophageal obstruction due to food impaction  Sleep disturbance We will continue to treat his allergies as he has been.  He states that he plans to follow-up with GI concerning getting the endoscopy done. I then discussed sleep disturbance with him and gave you some information on this.  I also discussed dietary modification and did mention the [employer] diet as well as Mediterranean diet in regard to cutting back on carbohydrates.  Over 25  minutes, greater than 50% spent in counseling and coordination of care.

## 2017-12-17 NOTE — Patient Instructions (Signed)

## 2019-03-10 IMAGING — CR DG CHEST 2V
2 series · 2 of 2 positions shown · non-contrast
Comparison: None.

CLINICAL DATA: Esophageal foreign body

EXAM:
CHEST  2 VIEW

[chest pa]
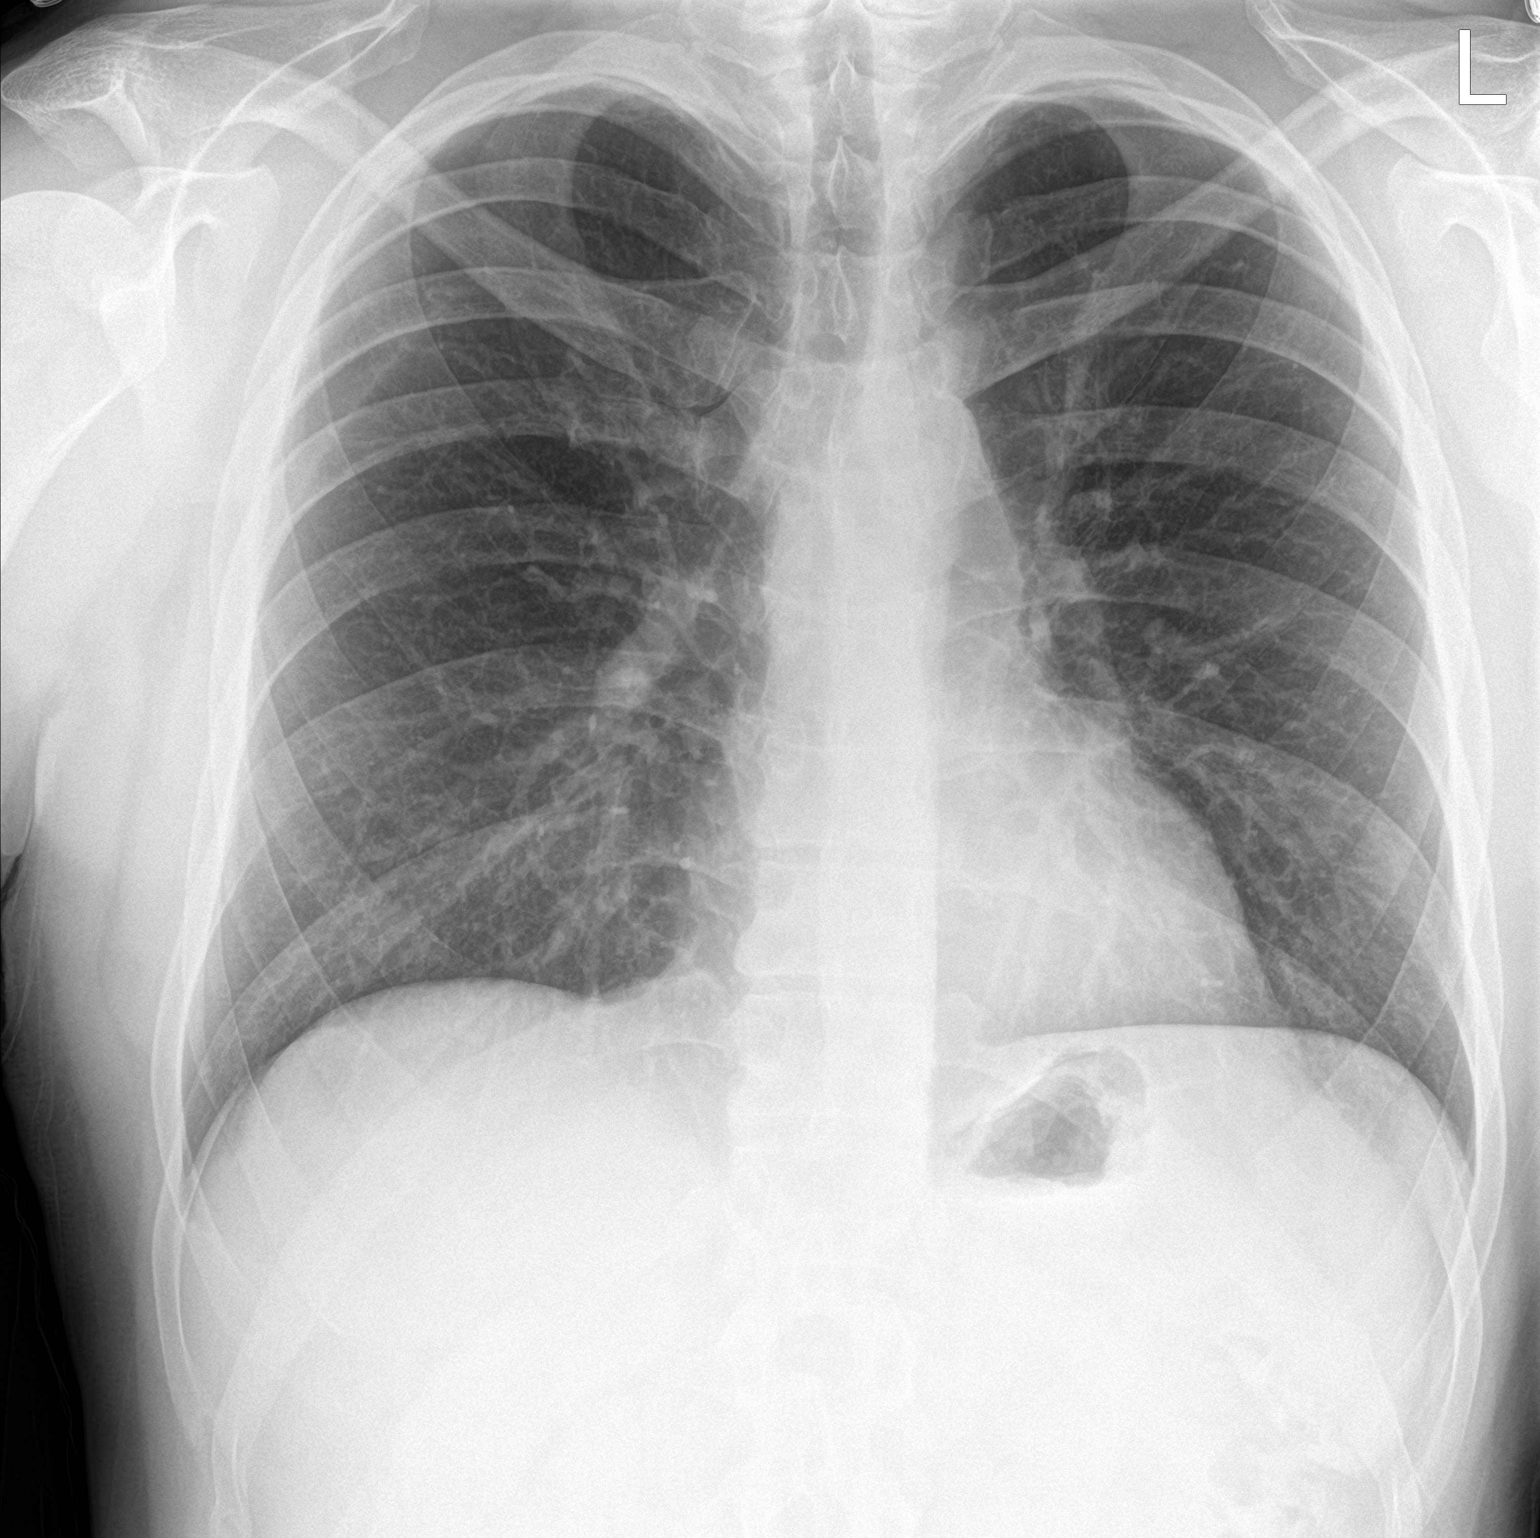

[chest lat]
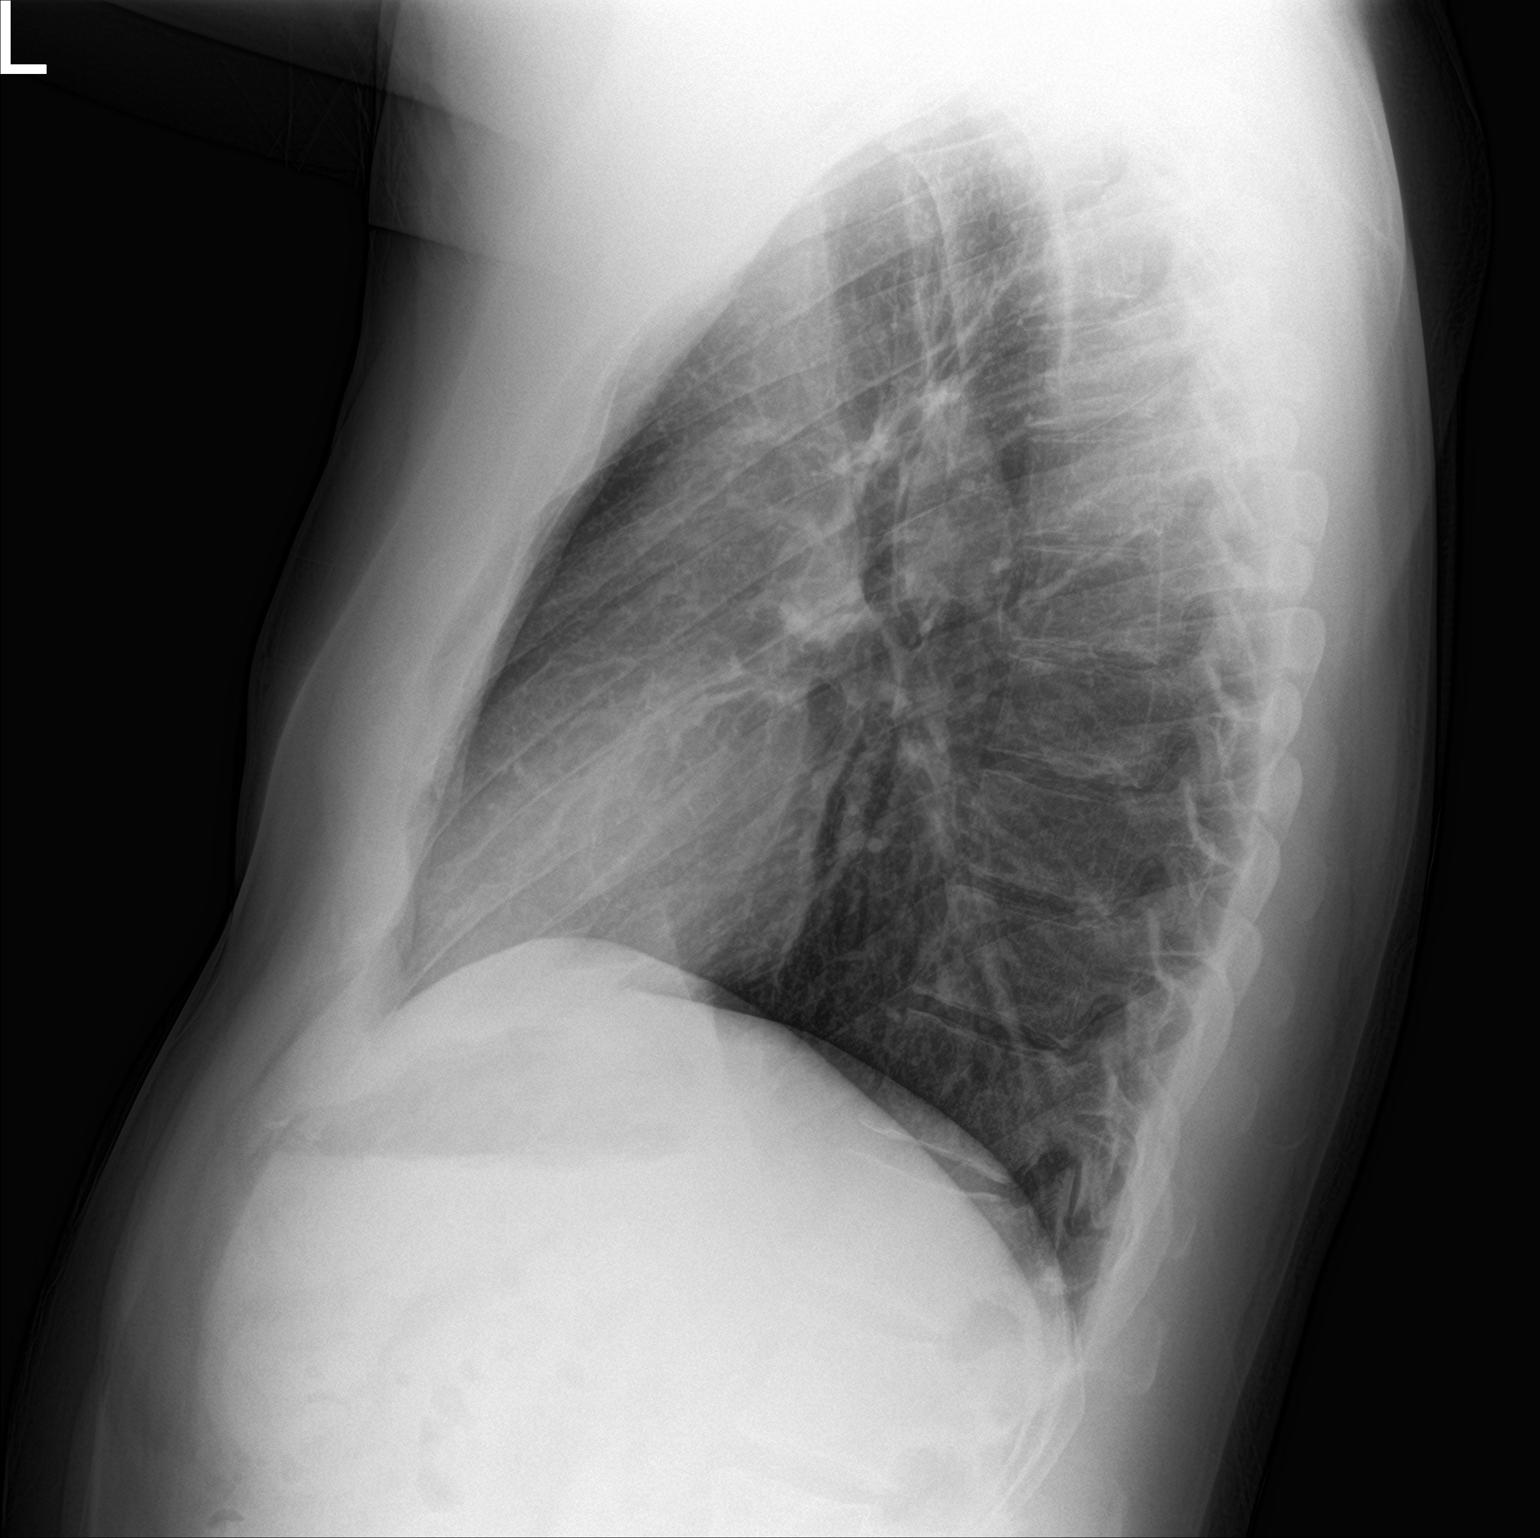

[2 of 2 positions shown; findings below may reference images not displayed]

FINDINGS: The heart size and mediastinal contours are within normal limits.
Both lungs are clear. The visualized skeletal structures are
unremarkable.
IMPRESSION: No active cardiopulmonary disease.

## 2020-05-30 ENCOUNTER — Other Ambulatory Visit: Admit: 2020-05-30 | Payer: PRIVATE HEALTH INSURANCE | Primary: Sports Medicine

## 2020-05-30 DIAGNOSIS — U071 COVID-19: Secondary | ICD-10-CM

## 2020-05-30 LAB — DIFFERENTIAL
Basophils Absolute: 42 /uL (ref 0–200)
Basophils Relative: 0.5 % (ref 0.0–1.0)
Eosinophils Absolute: 324 /uL (ref 15–500)
Eosinophils Relative: 3.9 % (ref 0.0–8.0)
Lymphocytes Absolute: 1693 /uL (ref 850–3900)
Lymphocytes Relative: 20.4 % (ref 15.0–45.0)
Monocytes Absolute: 390 /uL (ref 200–950)
Monocytes Relative: 4.7 % (ref 0.0–12.0)
Neutrophils Absolute: 5852 /uL (ref 1500–7800)
Neutrophils Relative: 70.5 % (ref 40.0–80.0)
nRBC: 0 /100 WBC (ref 0–0)

## 2020-05-30 LAB — CBC
Hematocrit: 45.8 % (ref 38.5–50.0)
Hemoglobin: 15.3 g/dL (ref 13.2–17.1)
MCH: 29.4 pg (ref 27.0–33.0)
MCHC: 33.5 g/dL (ref 32.0–36.0)
MCV: 87.9 fL (ref 80.0–100.0)
MPV: 7.7 fL (ref 7.5–11.5)
Platelets: 324 10*3/uL (ref 140–400)
RBC: 5.21 10*6/uL (ref 4.20–5.80)
RDW: 13.6 % (ref 11.0–15.0)
WBC: 8.3 10*3/uL (ref 3.8–10.8)

## 2020-05-30 LAB — COMPREHENSIVE METABOLIC PANEL
ALT: 68 U/L (ref 7–52)
AST: 31 U/L (ref 13–39)
Albumin: 4.5 g/dL (ref 3.5–5.7)
Alkaline Phosphatase: 54 U/L (ref 36–125)
Anion Gap: 9 mmol/L (ref 3–16)
BUN: 14 mg/dL (ref 7–25)
CO2: 31 mmol/L (ref 21–33)
Calcium: 9.5 mg/dL (ref 8.6–10.3)
Chloride: 102 mmol/L (ref 98–110)
Creatinine: 1.06 mg/dL (ref 0.60–1.30)
Glucose: 104 mg/dL (ref 70–100)
Osmolality, Calculated: 295 mOsm/kg (ref 278–305)
Potassium: 4.1 mmol/L (ref 3.5–5.3)
Sodium: 142 mmol/L (ref 133–146)
Total Bilirubin: 0.7 mg/dL (ref 0.0–1.5)
Total Protein: 7 g/dL (ref 6.4–8.9)
eGFR AA CKD-EPI: >90 See note.
eGFR NONAA CKD-EPI: 87 See note.

## 2020-05-30 LAB — SED RATE: Sed Rate: 12 mm/hr (ref 0–15)

## 2020-05-30 LAB — C-REACTIVE PROTEIN: CRP: 1 mg/L (ref 1.0–10.0)

## 2020-05-30 LAB — FERRITIN: Ferritin: 281 ng/mL (ref 23.9–336.2)

## 2020-08-18 ENCOUNTER — Ambulatory Visit: Payer: 344 | Primary: Sports Medicine

## 2020-08-18 ENCOUNTER — Ambulatory Visit: Admit: 2020-08-18 | Discharge: 2020-08-18 | Payer: PRIVATE HEALTH INSURANCE | Primary: Sports Medicine

## 2020-08-18 DIAGNOSIS — Z23 Encounter for immunization: Secondary | ICD-10-CM

## 2020-11-30 ENCOUNTER — Ambulatory Visit
Admit: 2020-11-30 | Discharge: 2020-11-30 | Payer: PRIVATE HEALTH INSURANCE | Attending: Sports Medicine | Primary: Sports Medicine

## 2020-11-30 DIAGNOSIS — S060X0A Concussion without loss of consciousness, initial encounter: Secondary | ICD-10-CM

## 2020-11-30 MED ORDER — azithromycin (ZITHROMAX) 250 MG tablet
250 | ORAL_TABLET | ORAL | 0 refills | Status: AC
Start: 2020-11-30 — End: ?

## 2020-11-30 MED ORDER — zolpidem (AMBIEN) 10 mg tablet
10 | ORAL_TABLET | Freq: Every evening | ORAL | 0 refills | Status: AC | PRN
Start: 2020-11-30 — End: 2020-12-10
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
## 2020-12-01 ENCOUNTER — Ambulatory Visit: Admit: 2020-12-01 | Payer: PRIVATE HEALTH INSURANCE | Primary: Sports Medicine

## 2020-12-01 DIAGNOSIS — U071 COVID-19: Secondary | ICD-10-CM

## 2020-12-01 LAB — CBC
Hematocrit: 45.7 % (ref 38.5–50.0)
Hemoglobin: 15.3 g/dL (ref 13.2–17.1)
MCH: 29.2 pg (ref 27.0–33.0)
MCHC: 33.4 g/dL (ref 32.0–36.0)
MCV: 87.2 fL (ref 80.0–100.0)
MPV: 7.7 fL (ref 7.5–11.5)
Platelets: 283 10E3/uL (ref 140–400)
RBC: 5.24 10E6/uL (ref 4.20–5.80)
RDW: 13.6 % (ref 11.0–15.0)
WBC: 8.1 10E3/uL (ref 3.8–10.8)

## 2020-12-01 LAB — COMPREHENSIVE METABOLIC PANEL
ALT: 57 U/L — ABNORMAL HIGH (ref 7–52)
AST: 40 U/L — ABNORMAL HIGH (ref 13–39)
Albumin: 4.2 g/dL (ref 3.5–5.7)
Alkaline Phosphatase: 47 U/L (ref 36–125)
Anion Gap: 12 mmol/L (ref 3–16)
BUN: 13 mg/dL (ref 7–25)
CO2: 27 mmol/L (ref 21–33)
Calcium: 9.4 mg/dL (ref 8.6–10.3)
Chloride: 104 mmol/L (ref 98–110)
Creatinine: 1.15 mg/dL (ref 0.60–1.30)
EGFR: 82
Glucose: 103 mg/dL — ABNORMAL HIGH (ref 70–100)
Osmolality, Calculated: 296 mosm/kg (ref 278–305)
Potassium: 4.7 mmol/L (ref 3.5–5.3)
Sodium: 143 mmol/L (ref 133–146)
Total Bilirubin: 0.8 mg/dL (ref 0.0–1.5)
Total Protein: 6.8 g/dL (ref 6.4–8.9)

## 2020-12-01 LAB — LIPID PANEL
Cholesterol, Total: 226 mg/dL — ABNORMAL HIGH (ref 0–200)
HDL: 52 mg/dL — ABNORMAL LOW (ref 60–92)
LDL Cholesterol: 138 mg/dL
Triglycerides: 179 mg/dL — ABNORMAL HIGH (ref 10–149)

## 2020-12-01 LAB — FERRITIN: Ferritin: 172.8 ng/mL (ref 23.9–336.2)

## 2022-02-09 ENCOUNTER — Encounter: Admit: 2022-02-09 | Payer: PRIVATE HEALTH INSURANCE | Primary: Sports Medicine

## 2022-02-09 ENCOUNTER — Ambulatory Visit
Admit: 2022-02-09 | Discharge: 2022-02-09 | Payer: PRIVATE HEALTH INSURANCE | Attending: Sports Medicine | Primary: Sports Medicine

## 2022-02-09 DIAGNOSIS — U071 COVID-19: Secondary | ICD-10-CM

## 2022-02-09 DIAGNOSIS — Z0000 Encounter for general adult medical examination without abnormal findings: Secondary | ICD-10-CM

## 2022-02-09 LAB — HEMOGLOBIN A1C: Hemoglobin A1C: 5.5 % (ref 4.0–5.6)

## 2022-02-09 LAB — COMPREHENSIVE METABOLIC PANEL
ALT: 37 U/L (ref 7–52)
AST: 16 U/L (ref 13–39)
Albumin: 4.8 g/dL (ref 3.5–5.7)
Alkaline Phosphatase: 61 U/L (ref 36–125)
Anion Gap: 11 mmol/L (ref 3–16)
BUN: 13 mg/dL (ref 7–25)
CO2: 26 mmol/L (ref 21–33)
Calcium: 10.1 mg/dL (ref 8.6–10.3)
Chloride: 102 mmol/L (ref 98–110)
Creatinine: 1.21 mg/dL (ref 0.60–1.30)
EGFR: 76
Glucose: 108 mg/dL (ref 70–100)
Osmolality, Calculated: 289 mOsm/kg (ref 278–305)
Potassium: 4.1 mmol/L (ref 3.5–5.3)
Sodium: 139 mmol/L (ref 133–146)
Total Bilirubin: 1.1 mg/dL (ref 0.0–1.5)
Total Protein: 7.8 g/dL (ref 6.4–8.9)

## 2022-02-09 LAB — LIPID PANEL
Cholesterol, Total: 186 mg/dL (ref 0–200)
HDL: 40 mg/dL — ABNORMAL LOW (ref 60–92)
LDL Cholesterol: 124 mg/dL
Non-HDL Cholesterol, Calculated: 146 mg/dL — ABNORMAL HIGH (ref 0–129)
Triglycerides: 111 mg/dL (ref 10–149)

## 2022-02-09 LAB — CBC
Hematocrit: 49.1 % (ref 38.5–50.0)
Hemoglobin: 17 g/dL (ref 13.2–17.1)
MCH: 30 pg (ref 27.0–33.0)
MCHC: 34.5 g/dL (ref 32.0–36.0)
MCV: 86.8 fL (ref 80.0–100.0)
MPV: 8.4 fL (ref 7.5–11.5)
Platelets: 340 10*3/uL (ref 140–400)
RBC: 5.66 10*6/uL (ref 4.20–5.80)
RDW: 13.3 % (ref 11.0–15.0)
WBC: 10.8 10*3/uL (ref 3.8–10.8)

## 2022-02-09 LAB — THYROID FUNCTION CASCADE: TSH: 1.28 u[IU]/mL (ref 0.45–4.12)

## 2022-02-09 LAB — FERRITIN: Ferritin: 208.3 ng/mL (ref 23.9–336.2)

## 2022-02-09 LAB — TESTOSTERONE: Testosterone: 269 ng/dL (ref 175–781)

## 2022-02-09 MED ORDER — sertraline (ZOLOFT) 50 MG tablet
50 | ORAL_TABLET | Freq: Two times a day (BID) | ORAL | 0 refills | Status: AC
Start: 2022-02-09 — End: 2022-05-10

## 2022-02-09 NOTE — Unmapped (Signed)
Subjective:       Terry Swanson is a 41 y.o. male and is here for a comprehensive physical exam. The patient reports problems - only has been going through some significant stresses at work.  He will be out of a job at the end of the month.  He has been working diligently on weight loss.  He also has some stressors going on at home including her relationship with his girlfriend and a recently terminated pregnancy. Marland Kitchen    He denies any suicidal homicidal ideations.  He is quite distressed.  He has had a long history of mental health concerns.  He is currently working with a Warden/ranger.    The following portions of the patient's history were reviewed and updated as appropriate: allergies, current medications, past family history, past medical history, past social history, past surgical history, and problem list.    Review of Systems  Pertinent items are noted in HPI.     Objective:      BP 122/80 (BP Location: Left arm, Patient Position: Sitting)    Pulse 100    Ht 6' 10 (2.083 m)    Wt (!) 264 lb 9.6 oz (120 kg)    SpO2 94%    BMI 27.67 kg/m??   General appearance: alert, appears stated age, and cooperative  Head: Normocephalic, without obvious abnormality, atraumatic  Throat: lips, mucosa, and tongue normal; teeth and gums normal  Lungs: clear to auscultation bilaterally and no respiratory distress  Heart: regular rate and rhythm, S1, S2 normal, no murmur, click, rub or gallop  Abdomen: soft, non-tender; bowel sounds normal; no masses,  no organomegaly  Extremities: extremities normal, atraumatic, no cyanosis or edema  Pulses: 2+ and symmetric  Skin: Skin color, texture, turgor normal. No rashes or lesions  Lymph nodes: Cervical, supraclavicular, and axillary nodes normal.  Neurologic: Grossly normal      Assessment:      Healthy male exam.       Plan:      1.  He is dealing with some concerning signs of depression.  Prescription for Zoloft has been sent to the pharmacy to see how he does with this.  I recommended  basic routine lab work.  Lab orders have been placed.  Once again I counseled him for safety.  He once again denies any suicidal or homicidal ideations.  I will see him back as needed.  He voiced understand the care plan and all his questions were answered.    2. Patient Counseling:  --Nutrition: Stressed importance of moderation in sodium/caffeine intake, saturated fat and cholesterol, caloric balance, sufficient intake of fresh fruits, vegetables, fiber, calcium, iron, and 1 mg of folate supplement per day (for females capable of pregnancy).  --Discussed the issue of estrogen replacement, calcium supplement, and the daily use of baby aspirin.  --Exercise: Stressed the importance of regular exercise.   --Substance Abuse: Discussed cessation/primary prevention of tobacco, alcohol, or other drug use; driving or other dangerous activities under the influence; availability of treatment for abuse.    --Sexuality: Discussed sexually transmitted diseases, partner selection, use of condoms, avoidance of unintended pregnancy  and contraceptive alternatives.   --Injury prevention: Discussed safety belts, safety helmets, smoke detector, smoking near bedding or upholstery.   --Dental health: Discussed importance of regular tooth brushing, flossing, and dental visits.  --Immunizations reviewed.  --Discussed benefits of screening colonoscopy.  --After hours service discussed with patient    3. Discussed the patient's BMI with him.  The BMI  is above average; BMI management plan is completed.  4. Follow up as needed for acute illness
